# Patient Record
Sex: Female | Born: 1970 | Race: Black or African American | Hispanic: No | Marital: Married | Smoking: Never smoker
Health system: Southern US, Community
[De-identification: ages and names within clinical notes are randomized; demographics above are authoritative.]

## PROBLEM LIST (undated history)

## (undated) DIAGNOSIS — R3915 Urgency of urination: Secondary | ICD-10-CM

## (undated) DIAGNOSIS — K219 Gastro-esophageal reflux disease without esophagitis: Secondary | ICD-10-CM

## (undated) DIAGNOSIS — I10 Essential (primary) hypertension: Secondary | ICD-10-CM

## (undated) DIAGNOSIS — D219 Benign neoplasm of connective and other soft tissue, unspecified: Secondary | ICD-10-CM

## (undated) HISTORY — DX: Urgency of urination: R39.15

## (undated) HISTORY — DX: Gastro-esophageal reflux disease without esophagitis: K21.9

## (undated) HISTORY — PX: TUBAL LIGATION: SHX77

## (undated) HISTORY — DX: Essential (primary) hypertension: I10

## (undated) HISTORY — PX: ENDOMETRIAL ABLATION: SHX621

## (undated) HISTORY — DX: Benign neoplasm of connective and other soft tissue, unspecified: D21.9

---

## 1999-01-24 ENCOUNTER — Other Ambulatory Visit: Admission: RE | Admit: 1999-01-24 | Discharge: 1999-01-24 | Payer: Self-pay | Admitting: Internal Medicine

## 2003-05-23 ENCOUNTER — Other Ambulatory Visit: Admission: RE | Admit: 2003-05-23 | Discharge: 2003-05-23 | Payer: Self-pay | Admitting: Obstetrics and Gynecology

## 2004-05-26 ENCOUNTER — Other Ambulatory Visit: Admission: RE | Admit: 2004-05-26 | Discharge: 2004-05-26 | Payer: Self-pay | Admitting: Obstetrics and Gynecology

## 2004-11-25 ENCOUNTER — Emergency Department (HOSPITAL_COMMUNITY): Admission: EM | Admit: 2004-11-25 | Discharge: 2004-11-25 | Payer: Self-pay | Admitting: Emergency Medicine

## 2005-06-02 ENCOUNTER — Other Ambulatory Visit: Admission: RE | Admit: 2005-06-02 | Discharge: 2005-06-02 | Payer: Self-pay | Admitting: Obstetrics and Gynecology

## 2006-06-11 ENCOUNTER — Other Ambulatory Visit: Admission: RE | Admit: 2006-06-11 | Discharge: 2006-06-11 | Payer: Self-pay | Admitting: Obstetrics and Gynecology

## 2006-09-18 ENCOUNTER — Emergency Department (HOSPITAL_COMMUNITY): Admission: EM | Admit: 2006-09-18 | Discharge: 2006-09-18 | Payer: Self-pay | Admitting: Emergency Medicine

## 2006-11-19 ENCOUNTER — Ambulatory Visit: Admission: RE | Admit: 2006-11-19 | Discharge: 2006-11-19 | Payer: Self-pay | Admitting: Obstetrics and Gynecology

## 2006-11-19 ENCOUNTER — Encounter: Payer: Self-pay | Admitting: Vascular Surgery

## 2007-07-06 ENCOUNTER — Other Ambulatory Visit: Admission: RE | Admit: 2007-07-06 | Discharge: 2007-07-06 | Payer: Self-pay | Admitting: Obstetrics and Gynecology

## 2007-11-10 HISTORY — PX: VAGINAL HYSTERECTOMY: SUR661

## 2007-11-23 ENCOUNTER — Ambulatory Visit (HOSPITAL_BASED_OUTPATIENT_CLINIC_OR_DEPARTMENT_OTHER): Admission: RE | Admit: 2007-11-23 | Discharge: 2007-11-23 | Payer: Self-pay | Admitting: Surgery

## 2007-11-23 ENCOUNTER — Ambulatory Visit (HOSPITAL_COMMUNITY): Admission: RE | Admit: 2007-11-23 | Discharge: 2007-11-23 | Payer: Self-pay | Admitting: Surgery

## 2007-11-24 ENCOUNTER — Encounter: Admission: RE | Admit: 2007-11-24 | Discharge: 2008-02-22 | Payer: Self-pay | Admitting: Surgery

## 2007-11-28 ENCOUNTER — Ambulatory Visit (HOSPITAL_COMMUNITY): Admission: RE | Admit: 2007-11-28 | Discharge: 2007-11-28 | Payer: Self-pay | Admitting: Surgery

## 2007-12-02 ENCOUNTER — Ambulatory Visit: Payer: Self-pay | Admitting: Internal Medicine

## 2008-01-23 ENCOUNTER — Ambulatory Visit (HOSPITAL_COMMUNITY): Admission: RE | Admit: 2008-01-23 | Discharge: 2008-01-23 | Payer: Self-pay | Admitting: Surgery

## 2008-05-31 ENCOUNTER — Encounter: Payer: Self-pay | Admitting: Obstetrics and Gynecology

## 2008-05-31 ENCOUNTER — Ambulatory Visit (HOSPITAL_BASED_OUTPATIENT_CLINIC_OR_DEPARTMENT_OTHER): Admission: RE | Admit: 2008-05-31 | Discharge: 2008-06-01 | Payer: Self-pay | Admitting: Obstetrics and Gynecology

## 2008-07-20 ENCOUNTER — Ambulatory Visit: Payer: Self-pay | Admitting: Obstetrics and Gynecology

## 2008-09-17 ENCOUNTER — Encounter: Payer: Self-pay | Admitting: Obstetrics and Gynecology

## 2008-09-17 ENCOUNTER — Ambulatory Visit: Payer: Self-pay | Admitting: Obstetrics and Gynecology

## 2008-09-17 ENCOUNTER — Other Ambulatory Visit: Admission: RE | Admit: 2008-09-17 | Discharge: 2008-09-17 | Payer: Self-pay | Admitting: Obstetrics and Gynecology

## 2009-03-08 ENCOUNTER — Ambulatory Visit: Payer: Self-pay | Admitting: Obstetrics and Gynecology

## 2009-09-26 ENCOUNTER — Other Ambulatory Visit: Admission: RE | Admit: 2009-09-26 | Discharge: 2009-09-26 | Payer: Self-pay | Admitting: Obstetrics and Gynecology

## 2009-09-26 ENCOUNTER — Ambulatory Visit: Payer: Self-pay | Admitting: Obstetrics and Gynecology

## 2010-07-16 ENCOUNTER — Encounter: Admission: RE | Admit: 2010-07-16 | Discharge: 2010-08-07 | Payer: Self-pay | Admitting: *Deleted

## 2010-09-30 ENCOUNTER — Other Ambulatory Visit
Admission: RE | Admit: 2010-09-30 | Discharge: 2010-09-30 | Payer: Self-pay | Source: Home / Self Care | Admitting: Obstetrics and Gynecology

## 2010-09-30 ENCOUNTER — Ambulatory Visit: Payer: Self-pay | Admitting: Obstetrics and Gynecology

## 2011-03-24 NOTE — Op Note (Signed)
Ann Graves, COURSER NO.:  [id number]   MEDICAL RECORD NO.:  [id number]           PATIENT TYPE:   LOCATION:                                 FACILITY:   PHYSICIAN:  Daniel L. Gottsegen, M.D.DATE OF BIRTH:  1970/12/28   DATE OF PROCEDURE:  05/31/2008  DATE OF DISCHARGE:                               OPERATIVE REPORT   PREOPERATIVE DIAGNOSIS:  Menometrorrhagia with multiple fibroids  including one submucous myoma.   POSTOPERATIVE DIAGNOSIS:  Menometrorrhagia with multiple fibroids  including one submucous myoma.   OPERATION:  Vaginal hysterectomy.   SURGEON:  Daniel L. Eda Paschal, M.D.   FIRST ASSISTANT:  Juan H. Lily Peer, M.D.   ANESTHESIA:  General endotracheal.   INDICATIONS:  The patient is a 40 year old gravida 3, para 2, AB 1 who  had presented to the office with menometrorrhagia associated with  anemia.  The patient has had a previous tubal ligation.  Because of the  menometrorrhagia she underwent endometrial biopsies.  She underwent  saline infusion histogram.  At saline infusion histogram she had  multiple myomas.  She had approximately 6 of them, one of them was  submucous but it was mostly outside the uterine cavity in the  myometrium.  As a result of this persistent abnormal bleeding associated  with anemia she now enters the hospital for a vaginal hysterectomy.  Because of her age we will preserve her ovaries.   FINDINGS:  The patient's uterus was enlarged by multiple myomas to 252  grams.  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  Pelvic peritoneum was free of all  disease.   PROCEDURE IN DETAIL:  After adequate general endotracheal anesthesia the  patient was placed in the dorsal lithotomy position, prepped and draped  in the usual sterile manner.  A 1:200,000 solution of epinephrine and 1%  Xylocaine was injected around the cervix.  A 360 degree incision was  made around the  cervix.  The bladder was mobilized superiorly as was the  posterior peritoneum.  Posterior peritoneum and vesicouterine fold of  peritoneum were entered with sharp dissection.  The uterosacral  ligaments were clamped.  In clamping them they were shortened.  They  were sutured to the vault laterally for good vault support.  Cardinal  ligaments, uterine arteries and more of the broad ligament was clamped,  cut and suture ligated.  The uterus was then partially delivered.  Multiple myomectomies were done to compress the uterus so that it could  be completely delivered.  It was then completely delivered.  The  remainder of the broad ligament, utero-ovarian ligament, round ligament  and fallopian tubes were clamped, cut and suture ligated as well.  All  major vascular bundles were doubly ligated.  Suture material was zero  chromic for all the above.  The uterus was delivered in pieces and sent  to pathology for tissue diagnosis.  It was weighed at 252 grams.  The  hydatid cyst on her left fallopian tube was excised.  It was on a long  pedicle which was  clamped and cut and tied with zero Vicryl.  The  vaginal cuff was stitched to the posterior peritoneum with a running  locking zero Vicryl.  Copious irrigation was done with sterile saline.  Two sponge, needle and instrument counts were correct.  The cuff and the  peritoneum were closed with figure-of-eights of zero chromic.  In doing  that the cul-  de-sac was eliminated using a Halban's technique and entire cuff was  brought back together.  She was copiously irrigated again.  Blood loss  the entire procedure was 100 mL.  The patient tolerated the procedure  well and left the operating room in satisfactory condition draining  clear urine from her Foley catheter.      Daniel L. Eda Paschal, M.D.  Electronically Signed     DLG/MEDQ  D:  05/31/2008  T:  05/31/2008  Job:  [id number]

## 2011-03-24 NOTE — Procedures (Signed)
NAME:  Ann Graves, Ann Graves NO.:  [id number]   MEDICAL RECORD NO.:  [id number]          PATIENT TYPE:  OUT   LOCATION:  SLEEP CENTER                 FACILITY:  [facility]   PHYSICIAN:  Clinton D. Maple Hudson, MD, FCCP, FACPDATE OF BIRTH:  09/06/1970   DATE OF STUDY:  11/23/2007                            NOCTURNAL POLYSOMNOGRAM   REFERRING PHYSICIAN:  Thornton Park. Daphine Deutscher, MD   INDICATION FOR STUDY:  Hyposomnia with sleep apnea.   EPWORTH SLEEPINESS SCORE:  2/24, BMI 40.2, weight 220 pounds, height 62  inches, neck 14.5 inches.   MEDICATIONS:  Not reported.   Note that the patient states that she wakes up with heartburn and  choking.   SLEEP ARCHITECTURE:  Total sleep time 425 minutes with sleep deficiency  94.2%.  Stage 1 8%, stage 2 67.4%, stage 3 0.1%, REM 24.5% of total  sleep time.  Sleep latency 8.5 minutes, REM latency 86.5 minutes,  arousal index 4.2, awake after sleep onset 17 minutes.  No bedtime  medication taken.   RESPIRATORY DATA:  Apnea hypopnea index (AHI) 3.5 events per hour which  is within normal limits.  There were a total of 25 respiratory events  scored of which 4 were obstructed apneas and 21 hypopneas.  Events were  more common while supine.   OXYGEN DATA:  Very loud snoring with oxygen desaturation to a nadir of  89%.  Mean oxygen saturation through the study was 97.2% on room air.   CARDIAC DATA:  Normal sinus rhythm.   MOVEMENT-PARASOMNIA:  No significant movement disturbances, no bathroom  trips.   IMPRESSIONS-RECOMMENDATIONS:  1. Unremarkable sleep architecture.  Occasional respiratory events,      within normal limits, AHI 3.5 per hour (normal range 0.25 per      hour).  Events were more common while supine as expected.  Very      loud snoring with      oxygen desaturation to a nadir of 89%.  2. Note the patient comments that she occasionally awakens with      choking and heartburn.      Clinton D. Maple Hudson, MD, [facility], FACP  Diplomate,  Biomedical engineer of Sleep Medicine  Electronically Signed     CDY/MEDQ  D:  12/03/2007 09:36:37  T:  12/03/2007 20:44:55  Job:  [id number]

## 2011-06-18 ENCOUNTER — Encounter: Payer: Self-pay | Admitting: Obstetrics and Gynecology

## 2011-06-18 ENCOUNTER — Ambulatory Visit (INDEPENDENT_AMBULATORY_CARE_PROVIDER_SITE_OTHER): Payer: BC Managed Care – PPO | Admitting: Obstetrics and Gynecology

## 2011-06-18 ENCOUNTER — Encounter: Payer: Self-pay | Admitting: Gynecology

## 2011-06-18 DIAGNOSIS — B373 Candidiasis of vulva and vagina: Secondary | ICD-10-CM

## 2011-06-18 DIAGNOSIS — N760 Acute vaginitis: Secondary | ICD-10-CM

## 2011-06-18 DIAGNOSIS — B9689 Other specified bacterial agents as the cause of diseases classified elsewhere: Secondary | ICD-10-CM

## 2011-06-18 DIAGNOSIS — N949 Unspecified condition associated with female genital organs and menstrual cycle: Secondary | ICD-10-CM

## 2011-06-18 DIAGNOSIS — N898 Other specified noninflammatory disorders of vagina: Secondary | ICD-10-CM

## 2011-06-18 DIAGNOSIS — A499 Bacterial infection, unspecified: Secondary | ICD-10-CM

## 2011-06-18 NOTE — Progress Notes (Signed)
The patient came to see me today with a week history of a vaginal discharge with a disagreable odor. She has not tried any medication herself. She's had no vaginal bleeding with this.  External within normal limits. BUS within normal limits. Vaginal examination reveals a frothy discharge. Wet prep is positive for yeast,amine, and clue cells.  Assessment: 1. Bacterial vaginosis 2. Yeast vaginitis.  Plan: 1. Metrogel  vaginal cream in vagina for 5 days.2. Diflucan 200 mg daily for 4 days.

## 2011-08-17 ENCOUNTER — Other Ambulatory Visit: Payer: Self-pay | Admitting: Obstetrics and Gynecology

## 2011-08-17 DIAGNOSIS — Z1231 Encounter for screening mammogram for malignant neoplasm of breast: Secondary | ICD-10-CM

## 2011-09-01 ENCOUNTER — Ambulatory Visit (HOSPITAL_COMMUNITY)
Admission: RE | Admit: 2011-09-01 | Discharge: 2011-09-01 | Disposition: A | Discharge status: Home / Self Care | Payer: BC Managed Care – PPO | Source: Ambulatory Visit | Attending: Obstetrics and Gynecology | Admitting: Obstetrics and Gynecology

## 2011-09-01 DIAGNOSIS — Z1231 Encounter for screening mammogram for malignant neoplasm of breast: Secondary | ICD-10-CM | POA: Insufficient documentation

## 2011-09-08 ENCOUNTER — Other Ambulatory Visit: Payer: Self-pay | Admitting: *Deleted

## 2011-09-08 DIAGNOSIS — N63 Unspecified lump in unspecified breast: Secondary | ICD-10-CM

## 2011-09-18 ENCOUNTER — Ambulatory Visit
Admission: RE | Admit: 2011-09-18 | Discharge: 2011-09-18 | Disposition: A | Discharge status: Home / Self Care | Payer: BC Managed Care – PPO | Source: Ambulatory Visit | Attending: Obstetrics and Gynecology | Admitting: Obstetrics and Gynecology

## 2011-09-18 DIAGNOSIS — N63 Unspecified lump in unspecified breast: Secondary | ICD-10-CM

## 2012-01-13 ENCOUNTER — Encounter (HOSPITAL_COMMUNITY): Payer: Self-pay | Admitting: Emergency Medicine

## 2012-01-13 ENCOUNTER — Emergency Department (HOSPITAL_COMMUNITY)
Admission: EM | Admit: 2012-01-13 | Discharge: 2012-01-14 | Disposition: A | Discharge status: Home / Self Care | Payer: BC Managed Care – PPO | Attending: Emergency Medicine | Admitting: Emergency Medicine

## 2012-01-13 DIAGNOSIS — K219 Gastro-esophageal reflux disease without esophagitis: Secondary | ICD-10-CM | POA: Insufficient documentation

## 2012-01-13 DIAGNOSIS — A059 Bacterial foodborne intoxication, unspecified: Secondary | ICD-10-CM | POA: Insufficient documentation

## 2012-01-13 DIAGNOSIS — R197 Diarrhea, unspecified: Secondary | ICD-10-CM | POA: Insufficient documentation

## 2012-01-13 DIAGNOSIS — E119 Type 2 diabetes mellitus without complications: Secondary | ICD-10-CM | POA: Insufficient documentation

## 2012-01-13 DIAGNOSIS — E669 Obesity, unspecified: Secondary | ICD-10-CM | POA: Insufficient documentation

## 2012-01-13 DIAGNOSIS — R112 Nausea with vomiting, unspecified: Secondary | ICD-10-CM | POA: Insufficient documentation

## 2012-01-13 DIAGNOSIS — I10 Essential (primary) hypertension: Secondary | ICD-10-CM | POA: Insufficient documentation

## 2012-01-13 DIAGNOSIS — Z79899 Other long term (current) drug therapy: Secondary | ICD-10-CM | POA: Insufficient documentation

## 2012-01-13 DIAGNOSIS — R109 Unspecified abdominal pain: Secondary | ICD-10-CM | POA: Insufficient documentation

## 2012-01-13 LAB — CBC
HCT: 41.5 % (ref 36.0–46.0)
Hemoglobin: 13.6 g/dL (ref 12.0–15.0)
MCH: 27.5 pg (ref 26.0–34.0)
MCHC: 32.8 g/dL (ref 30.0–36.0)
RDW: 13.2 % (ref 11.5–15.5)

## 2012-01-13 LAB — DIFFERENTIAL
Basophils Absolute: 0 10*3/uL (ref 0.0–0.1)
Basophils Relative: 0 % (ref 0–1)
Eosinophils Absolute: 0.1 10*3/uL (ref 0.0–0.7)
Eosinophils Relative: 1 % (ref 0–5)
Monocytes Absolute: 0.4 10*3/uL (ref 0.1–1.0)
Monocytes Relative: 5 % (ref 3–12)

## 2012-01-13 NOTE — ED Notes (Signed)
Patient complaining of abdominal pain, nausea, vomiting, and diarrhea since this afternoon.  Patient reports she had chinese for lunch and that it "did not sit right of her stomach".

## 2012-01-14 LAB — URINALYSIS, ROUTINE W REFLEX MICROSCOPIC
Bilirubin Urine: NEGATIVE
Glucose, UA: NEGATIVE mg/dL
Ketones, ur: NEGATIVE mg/dL
Protein, ur: NEGATIVE mg/dL
pH: 7.5 (ref 5.0–8.0)

## 2012-01-14 LAB — BASIC METABOLIC PANEL
BUN: 15 mg/dL (ref 6–23)
Calcium: 9.4 mg/dL (ref 8.4–10.5)
Chloride: 102 mEq/L (ref 96–112)
Creatinine, Ser: 0.56 mg/dL (ref 0.50–1.10)
GFR calc Af Amer: >90 mL/min (ref >90)
GFR calc non Af Amer: >90 mL/min (ref >90)

## 2012-01-14 MED ORDER — HYOSCYAMINE SULFATE 0.125 MG PO TABS
0.2500 mg | ORAL_TABLET | Freq: Once | ORAL | Status: AC
  Administered 2012-01-14: 0.25 mg via ORAL
  Filled 2012-01-14: qty 2

## 2012-01-14 MED ORDER — SODIUM CHLORIDE 0.9 % IV BOLUS (SEPSIS)
1000.0000 mL | Freq: Once | INTRAVENOUS | Status: AC
  Administered 2012-01-14: 1000 mL via INTRAVENOUS

## 2012-01-14 MED ORDER — METOCLOPRAMIDE HCL 10 MG PO TABS: 10.0000 mg | ORAL_TABLET | Freq: Four times a day (QID) | ORAL | Status: AC

## 2012-01-14 MED ORDER — DIPHENOXYLATE-ATROPINE 2.5-0.025 MG PO TABS: 1.0000 | ORAL_TABLET | Freq: Four times a day (QID) | ORAL | Status: AC | PRN

## 2012-01-14 MED ORDER — ONDANSETRON HCL 4 MG/2ML IJ SOLN
4.0000 mg | Freq: Once | INTRAMUSCULAR | Status: AC
  Administered 2012-01-14: 4 mg via INTRAVENOUS
  Filled 2012-01-14: qty 2

## 2012-01-14 MED ORDER — DIPHENOXYLATE-ATROPINE 2.5-0.025 MG PO TABS
1.0000 | ORAL_TABLET | Freq: Once | ORAL | Status: AC
  Administered 2012-01-14: 1 via ORAL
  Filled 2012-01-14: qty 1

## 2012-01-14 NOTE — ED Notes (Signed)
Patient stated she is feeling a lot better

## 2012-01-14 NOTE — ED Provider Notes (Signed)
History     CSN: [id number]  Arrival date & time 01/13/12  2228   First MD Initiated Contact with Patient 01/14/12 0132      Chief Complaint  Patient presents with  . Abdominal Pain  . Diarrhea    (Consider location/radiation/quality/duration/timing/severity/associated sxs/prior treatment) Patient is a 41 y.o. female presenting with abdominal pain and diarrhea. The history is provided by the patient.  Abdominal Pain The primary symptoms of the illness include abdominal pain, nausea, vomiting and diarrhea. The primary symptoms of the illness do not include fever.  Symptoms associated with the illness do not include chills.  Diarrhea The primary symptoms include abdominal pain, nausea, vomiting and diarrhea. Primary symptoms do not include fever or rash.  The illness does not include chills.   the patient is a 41 year old, female, with non-insulin-dependent diabetes, who presents to emergency department complaining of nausea, vomiting, abdominal cramps and diarrhea since she had [place] food yesterday.  She denies fevers, chills, or rash.  She has not been on antibiotics recently.  She has not been exposed to anybody with similar symptoms.  Past Medical History  Diagnosis Date  . GERD (gastroesophageal reflux disease)   . Fibroid     Adenomyoma  . Diabetes mellitus   . Hypertension     Past Surgical History  Procedure Date  . Tubal ligation   . Vaginal hysterectomy 2009    Left paratubal cyst    Family History  Problem Relation Age of Onset  . Diabetes Mother   . Hypertension Mother   . Heart disease Mother   . Diabetes Father   . Hypertension Father   . Heart disease Father   . Hypertension Maternal Grandmother   . Heart disease Maternal Grandmother   . Hypertension Maternal Grandfather     History  Substance Use Topics  . Smoking status: Never Smoker   . Smokeless tobacco: Never Used  . Alcohol Use: No    OB History    Grav Para Term Preterm Abortions TAB  SAB Ect Mult Living   3 2 2  1     2       Review of Systems  Constitutional: Negative for fever and chills.  Gastrointestinal: Positive for nausea, vomiting, abdominal pain and diarrhea. Negative for blood in stool.  Skin: Negative for rash.  Neurological: Negative for headaches.  All other systems reviewed and are negative.    Allergies  Review of patient's allergies indicates no known allergies.  Home Medications   Current Outpatient Rx  Name Route Sig Dispense Refill  . GLYBURIDE 5 MG PO TABS Oral Take 5 mg by mouth daily with breakfast.    . LISINOPRIL-HYDROCHLOROTHIAZIDE 20-25 MG PO TABS Oral Take 1 tablet by mouth daily.      BP 114/76  Pulse 79  Temp(Src) 97.7 F (36.5 C) (Oral)  Resp 20  SpO2 100%  Physical Exam  Vitals reviewed. Constitutional: She is oriented to person, place, and time. No distress.       Obese listless  HENT:  Head: Normocephalic and atraumatic.  Eyes: Conjunctivae are normal. Pupils are equal, round, and reactive to light.  Neck: Normal range of motion. Neck supple.  Cardiovascular: Normal rate.   No murmur heard. Pulmonary/Chest: Effort normal. No respiratory distress.  Abdominal: Soft. She exhibits no distension. There is no tenderness. There is no rebound and no guarding.  Musculoskeletal: Normal range of motion.  Neurological: She is alert and oriented to person, place, and time.  Skin: Skin  is warm and dry.  Psychiatric: She has a normal mood and affect. Thought content normal.    ED Course  Procedures (including critical care time) 41 year old, female, with non-insulin-dependent diabetes.  Presents emergency department with symptoms consistent with food poisoning.  She got, vomiting, diarrhea, and abdominal cramps.  Following eating [place] food yesterday.  She is not toxic and does not have an acute abdomen.  We will establish an IV give her fluids and treat her symptoms  Labs Reviewed  DIFFERENTIAL - Abnormal; Notable for the  following:    Neutrophils Relative 82 (*)    All other components within normal limits  BASIC METABOLIC PANEL - Abnormal; Notable for the following:    Glucose, Bld 138 (*)    All other components within normal limits  URINALYSIS, ROUTINE W REFLEX MICROSCOPIC  CBC  POCT PREGNANCY, URINE   No results found.   No diagnosis found.  3:23 AM Still has pain. Will give lomotil  4:08 AM sxs resolved  MDM  Food poisoning sxs resolved in ed. Not toxic. No distress.        Nicholes Stairs, MD 01/14/12 [phone]

## 2012-02-16 ENCOUNTER — Other Ambulatory Visit: Payer: Self-pay | Admitting: Obstetrics and Gynecology

## 2012-02-16 ENCOUNTER — Encounter: Payer: Self-pay | Admitting: Obstetrics and Gynecology

## 2012-02-16 ENCOUNTER — Ambulatory Visit (INDEPENDENT_AMBULATORY_CARE_PROVIDER_SITE_OTHER): Payer: BC Managed Care – PPO | Admitting: Obstetrics and Gynecology

## 2012-02-16 ENCOUNTER — Other Ambulatory Visit (HOSPITAL_COMMUNITY)
Admission: RE | Admit: 2012-02-16 | Discharge: 2012-02-16 | Disposition: A | Discharge status: Home / Self Care | Payer: BC Managed Care – PPO | Source: Ambulatory Visit | Attending: Obstetrics and Gynecology | Admitting: Obstetrics and Gynecology

## 2012-02-16 VITALS — BP 116/74 | Ht 63.0 in | Wt 224.0 lb

## 2012-02-16 DIAGNOSIS — Z01419 Encounter for gynecological examination (general) (routine) without abnormal findings: Secondary | ICD-10-CM

## 2012-02-16 DIAGNOSIS — R3915 Urgency of urination: Secondary | ICD-10-CM | POA: Insufficient documentation

## 2012-02-16 LAB — URINALYSIS W MICROSCOPIC + REFLEX CULTURE
Bilirubin Urine: NEGATIVE
Crystals: NONE SEEN
Hgb urine dipstick: NEGATIVE
Ketones, ur: NEGATIVE mg/dL
Protein, ur: NEGATIVE mg/dL
RBC / HPF: NONE SEEN RBC/hpf (ref <3)
Specific Gravity, Urine: 1.025 (ref 1.005–1.030)
Urobilinogen, UA: 0.2 mg/dL (ref 0.0–1.0)

## 2012-02-16 NOTE — Progress Notes (Signed)
The patient came back to see me today for her annual GYN exam. The past week she's had urinary frequency. She is not having dysuria, urgency, hematuria or incontinence. She is having no menopausal symptoms. Her urinalysis was just slightly abnormal. She is due for followup mammogram for abnormality found in October which was nonsuspicious. She will make an appointment. She is having no vaginal bleeding. She is having no pelvic pain.  HEENT: Within normal limits. Kennon Portela present Neck: No masses. Supraclavicular lymph nodes: Not enlarged. Breasts: Examined in both sitting and lying position. Symmetrical without skin changes or masses. Abdomen: Soft no masses guarding or rebound. No hernias. Pelvic: External within normal limits. BUS within normal limits. Vaginal examination shows good estrogen effect, no cystocele enterocele or rectocele. Cervix and uterus absent. Adnexa within normal limits. Rectovaginal confirmatory. Extremities within normal limits.  Assessment: Urinary frequency. Abnormal mammogram nonsuspicious.(probable benign lymph node)  Plan: Urine culture done. Discussed medication for DI if culture negative. Doubt patient will do since her symptoms are only one week. Followup mammogram of right breast. Patient will schedule.

## 2012-02-18 LAB — URINE CULTURE: Colony Count: 3000

## 2012-04-01 ENCOUNTER — Other Ambulatory Visit: Payer: Self-pay | Admitting: *Deleted

## 2012-04-01 DIAGNOSIS — N63 Unspecified lump in unspecified breast: Secondary | ICD-10-CM

## 2012-04-20 ENCOUNTER — Ambulatory Visit
Admission: RE | Admit: 2012-04-20 | Discharge: 2012-04-20 | Disposition: A | Discharge status: Home / Self Care | Payer: BC Managed Care – PPO | Source: Ambulatory Visit | Attending: Obstetrics and Gynecology | Admitting: Obstetrics and Gynecology

## 2012-04-20 DIAGNOSIS — N63 Unspecified lump in unspecified breast: Secondary | ICD-10-CM

## 2012-06-09 HISTORY — PX: THYROIDECTOMY: SHX17

## 2012-09-19 ENCOUNTER — Ambulatory Visit: Payer: BC Managed Care – PPO | Admitting: Women's Health

## 2012-09-26 ENCOUNTER — Ambulatory Visit: Payer: BC Managed Care – PPO | Admitting: Women's Health

## 2012-10-17 ENCOUNTER — Encounter: Payer: Self-pay | Admitting: Gynecology

## 2012-10-17 ENCOUNTER — Ambulatory Visit: Payer: BC Managed Care – PPO | Admitting: Obstetrics and Gynecology

## 2012-10-17 ENCOUNTER — Ambulatory Visit (INDEPENDENT_AMBULATORY_CARE_PROVIDER_SITE_OTHER): Payer: Managed Care, Other (non HMO) | Admitting: Gynecology

## 2012-10-17 VITALS — BP 128/88

## 2012-10-17 DIAGNOSIS — I10 Essential (primary) hypertension: Secondary | ICD-10-CM | POA: Insufficient documentation

## 2012-10-17 DIAGNOSIS — N898 Other specified noninflammatory disorders of vagina: Secondary | ICD-10-CM

## 2012-10-17 DIAGNOSIS — B373 Candidiasis of vulva and vagina: Secondary | ICD-10-CM

## 2012-10-17 DIAGNOSIS — E119 Type 2 diabetes mellitus without complications: Secondary | ICD-10-CM | POA: Insufficient documentation

## 2012-10-17 DIAGNOSIS — Z113 Encounter for screening for infections with a predominantly sexual mode of transmission: Secondary | ICD-10-CM

## 2012-10-17 LAB — WET PREP FOR TRICH, YEAST, CLUE

## 2012-10-17 MED ORDER — FLUCONAZOLE 100 MG PO TABS: ORAL_TABLET | ORAL | Status: DC

## 2012-10-17 NOTE — Progress Notes (Signed)
Patient is a type II diabetic the presented to the office today complaining over the past 3-4 weeks the vulvovaginal irritation pruritus. She brought Monistat over-the-counter yesterday and today was a secondary she applied the cream. She's not been sexually active over the past month. She has a history of a transvaginal hysterectomy in the past. She stated the recently she also had total thyroidectomy for 2 benign thyroid nodules in [facility] and is being followed by Dr. Boris Sharper endocrinologist.  Exam: Bartholin urethra Skene glands within normal limits External genitalia slightly raw and irritated from pruritus Vagina: White creamy material in the vagina probably attributed to the Monistat as she recently applied. Vaginal cuff: Intact Bimanual exam not done Rectal exam: Not done  Wet prep few yeast noted few white blood cells moderate bacteria.  Assessment/plan: Monilial vulvovaginitis. Patient to continue with her Monistat to complete the weeks course. I've given her prescription Diflucan 100 mg which she can take 1 tablet today to accelerate and improve with her symptoms. I have  given her 2 refills. I've explained to her that if her sugars at times may be out of control  That this may predispose her to yeast infections. Also since she recently had surgery and probably received antibiotics may have been a contributing factor as well. GC and Chlamydia culture pending at time of this dictation.

## 2012-10-17 NOTE — Patient Instructions (Addendum)
Candidal Vulvovaginitis Candidal vulvovaginitis is an infection of the vagina and vulva. The vulva is the skin around the opening of the vagina. This may cause itching and discomfort in and around the vagina.  HOME CARE  Only take medicine as told by your doctor.  Do not have sex (intercourse) until the infection is healed or as told by your doctor.  Practice safe sex.  Tell your sex partner about your infection.  Do not douche or use tampons.  Wear cotton underwear. Do not wear tight pants or panty hose.  Eat yogurt. This may help treat and prevent yeast infections. GET HELP RIGHT AWAY IF:   You have a fever.  Your problems get worse during treatment or do not get better in 3 days.  You have discomfort, irritation, or itching in your vagina or vulva area.  You have pain after sex.  You start to get belly (abdominal) pain. MAKE SURE YOU:  Understand these instructions.  Will watch your condition.  Will get help right away if you are not doing well or get worse. Document Released: 01/22/2009 Document Revised: 01/18/2012 Document Reviewed: 01/22/2009 [employer] Patient Information 2013 [employer].  

## 2014-09-10 ENCOUNTER — Encounter: Payer: Self-pay | Admitting: Gynecology

## 2016-02-25 ENCOUNTER — Other Ambulatory Visit: Payer: Self-pay

## 2016-02-25 DIAGNOSIS — Z1231 Encounter for screening mammogram for malignant neoplasm of breast: Secondary | ICD-10-CM

## 2016-03-16 ENCOUNTER — Ambulatory Visit: Payer: Managed Care, Other (non HMO)

## 2016-04-29 ENCOUNTER — Ambulatory Visit
Admission: RE | Admit: 2016-04-29 | Discharge: 2016-04-29 | Disposition: A | Discharge status: Home / Self Care | Payer: Managed Care, Other (non HMO) | Source: Ambulatory Visit

## 2016-04-29 ENCOUNTER — Ambulatory Visit: Payer: Managed Care, Other (non HMO)

## 2016-04-29 DIAGNOSIS — Z1231 Encounter for screening mammogram for malignant neoplasm of breast: Secondary | ICD-10-CM

## 2016-06-25 ENCOUNTER — Encounter: Payer: Self-pay | Admitting: Podiatry

## 2016-06-25 ENCOUNTER — Ambulatory Visit (INDEPENDENT_AMBULATORY_CARE_PROVIDER_SITE_OTHER): Payer: Managed Care, Other (non HMO) | Admitting: Podiatry

## 2016-06-25 VITALS — BP 115/66 | HR 80 | Resp 16 | Ht 65.0 in | Wt 238.0 lb

## 2016-06-25 DIAGNOSIS — E1149 Type 2 diabetes mellitus with other diabetic neurological complication: Secondary | ICD-10-CM | POA: Diagnosis not present

## 2016-06-25 DIAGNOSIS — E1140 Type 2 diabetes mellitus with diabetic neuropathy, unspecified: Secondary | ICD-10-CM

## 2016-06-25 DIAGNOSIS — M216X9 Other acquired deformities of unspecified foot: Secondary | ICD-10-CM

## 2016-06-25 DIAGNOSIS — Q828 Other specified congenital malformations of skin: Secondary | ICD-10-CM

## 2016-06-25 NOTE — Progress Notes (Signed)
   Subjective:    Patient ID: Ann Graves, female    DOB: [birth year], 45 y.o.   MRN: [id number]  HPI Chief Complaint  Patient presents with  . Callouses    Right foot; plantar forefoot; pt stated, "Hurts to walk on foot; tender to the touch on the outside of the callous"; pt Diabetic Type 2; Sugar=129 this am; A1C=7.0      Review of Systems  All other systems reviewed and are negative.      Objective:   Physical Exam        Assessment & Plan:

## 2016-08-01 IMAGING — MG DIGITAL SCREENING BILATERAL MAMMOGRAM WITH CAD
4 series · 4 of 4 positions shown · non-contrast
Comparison: Previous exam(s).

CLINICAL DATA: Screening.

EXAM:
DIGITAL SCREENING BILATERAL MAMMOGRAM WITH CAD

[L CC]
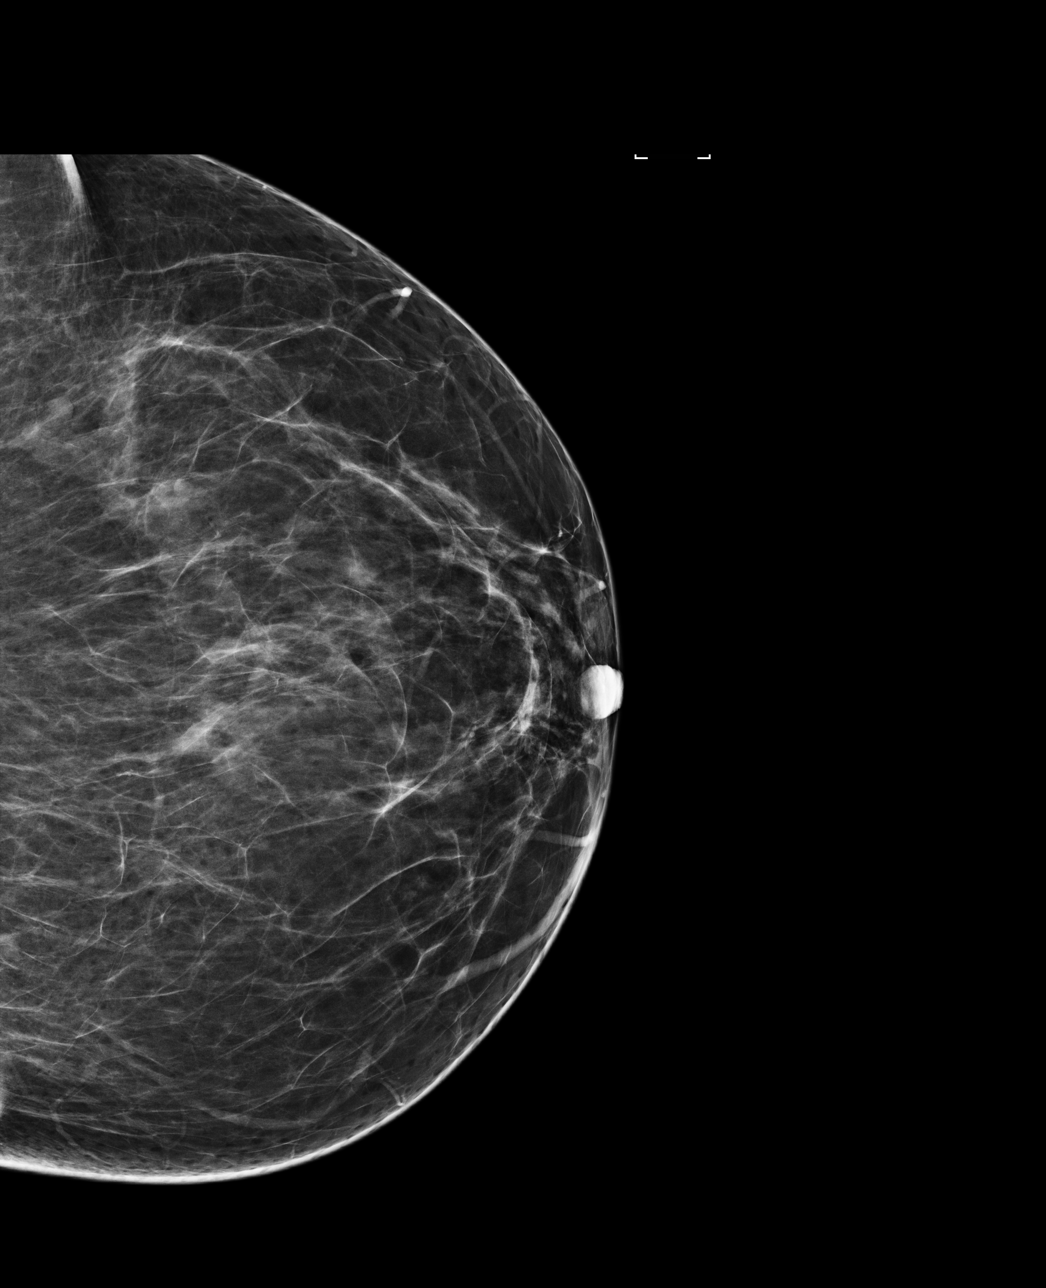

[L MLO]
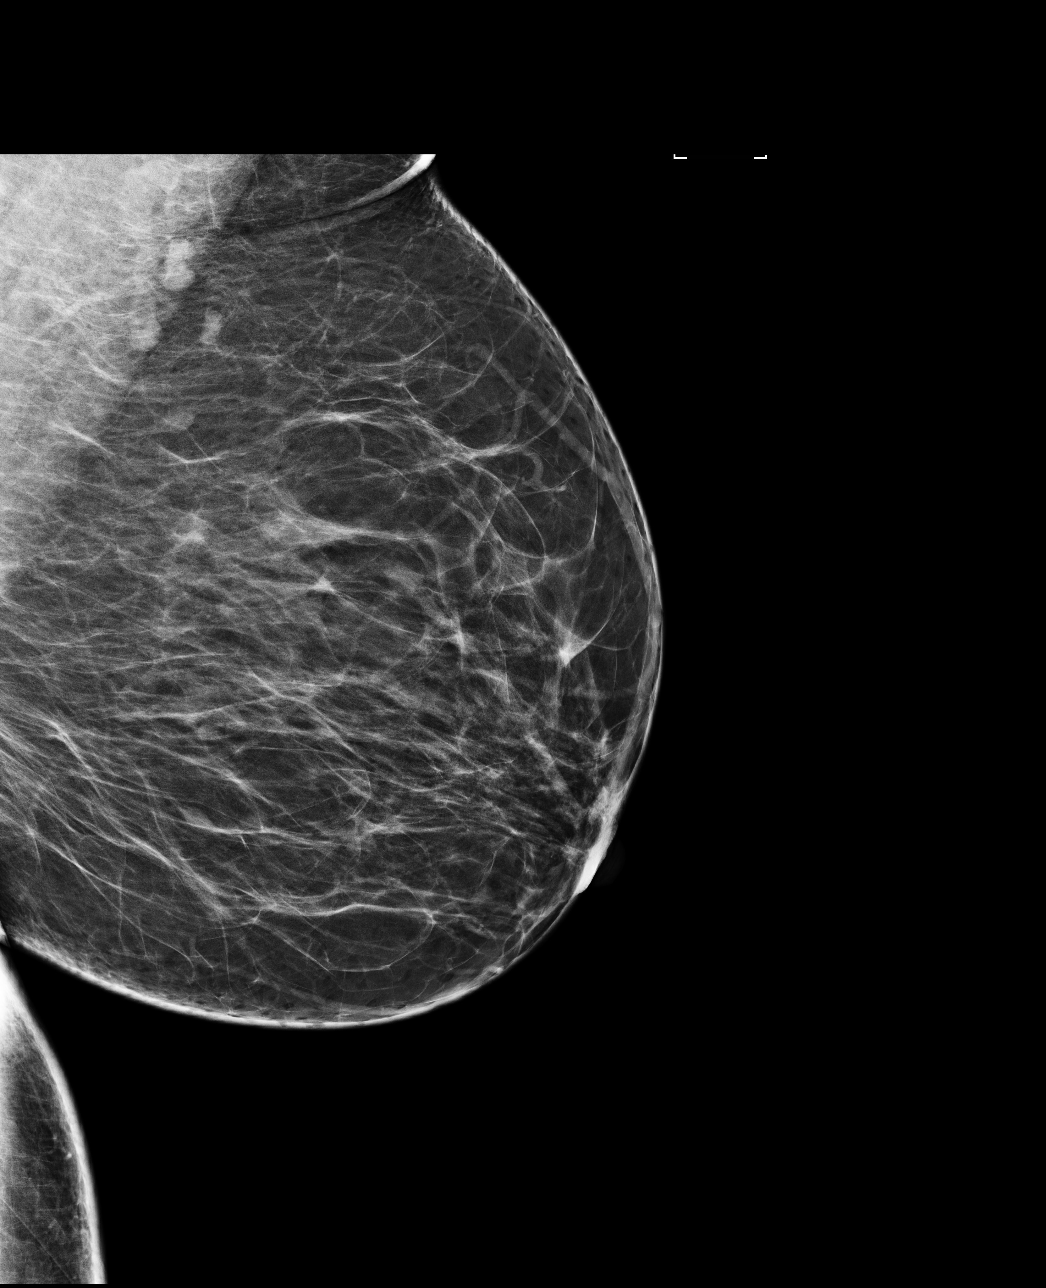

[R MLO]
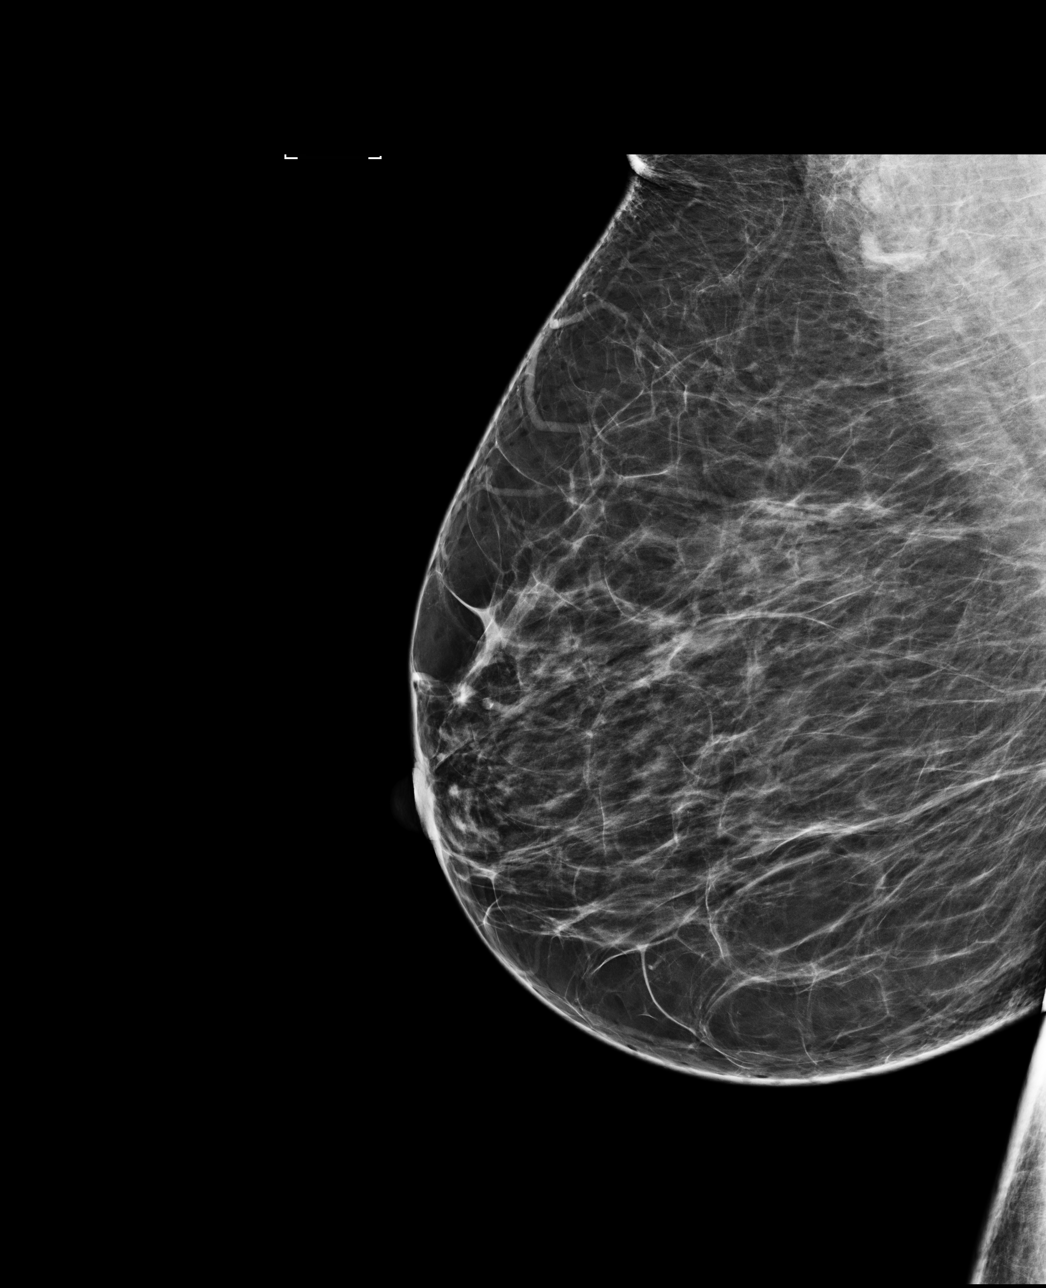

[R CC]
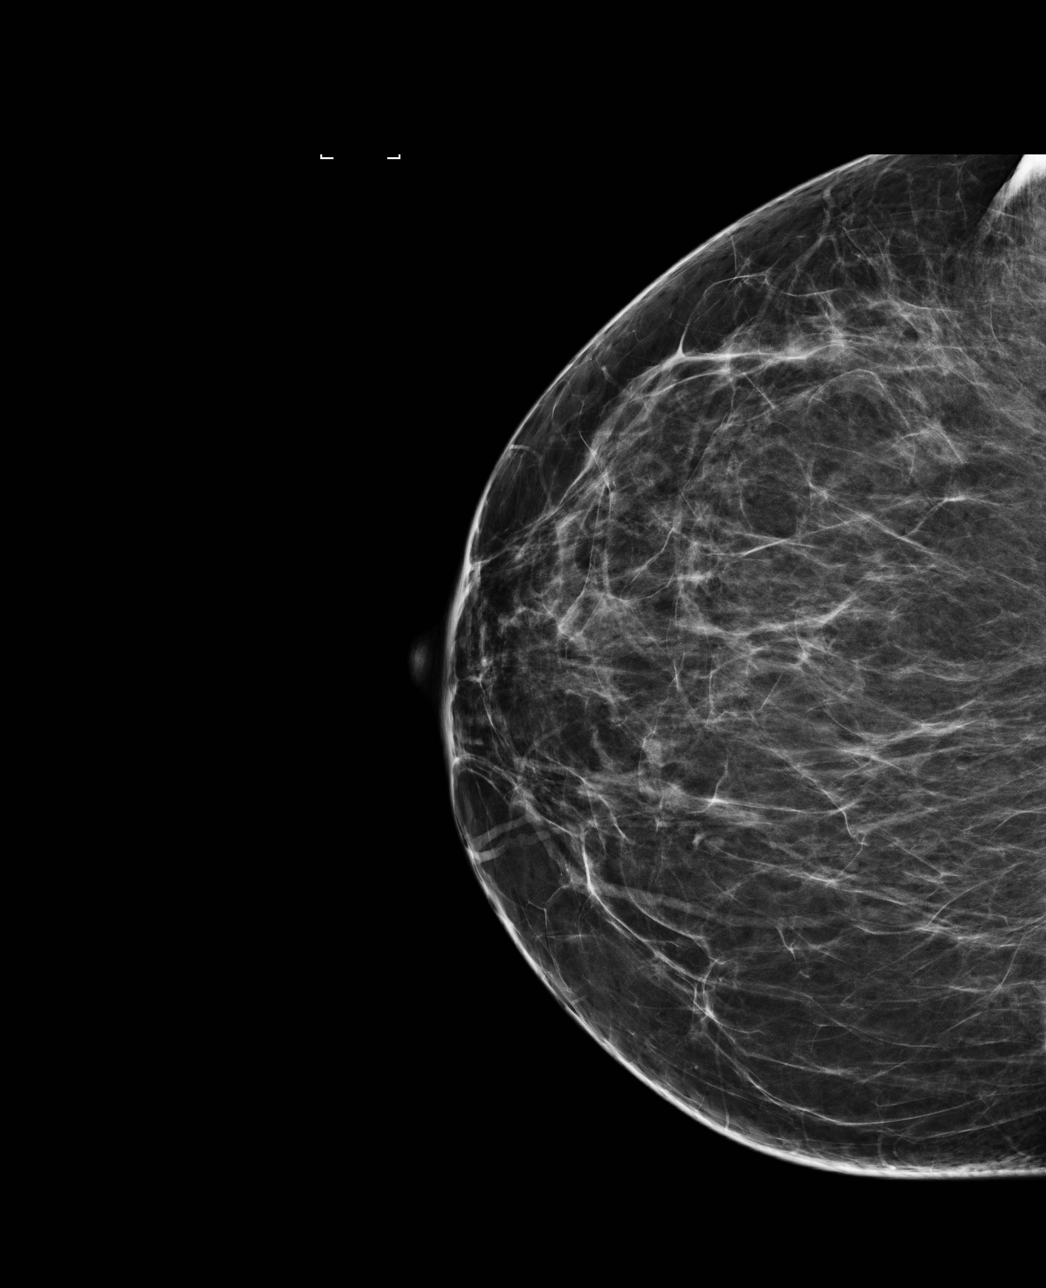

[4 of 4 positions shown; findings below may reference images not displayed]

ACR Breast Density Category b: There are scattered areas of
fibroglandular density.
FINDINGS: There are no findings suspicious for malignancy. Images were
processed with CAD.
IMPRESSION: No mammographic evidence of malignancy. A result letter of this
screening mammogram will be mailed directly to the patient.

RECOMMENDATION:
Screening mammogram in one year. (Code:AS-G-LCT)

BI-RADS CATEGORY  1: Negative.

## 2017-11-27 ENCOUNTER — Encounter (HOSPITAL_COMMUNITY): Payer: Self-pay | Admitting: Emergency Medicine

## 2017-11-27 ENCOUNTER — Other Ambulatory Visit: Payer: Self-pay

## 2017-11-27 DIAGNOSIS — Z794 Long term (current) use of insulin: Secondary | ICD-10-CM | POA: Insufficient documentation

## 2017-11-27 DIAGNOSIS — M545 Low back pain: Secondary | ICD-10-CM | POA: Insufficient documentation

## 2017-11-27 DIAGNOSIS — Z79899 Other long term (current) drug therapy: Secondary | ICD-10-CM | POA: Insufficient documentation

## 2017-11-27 DIAGNOSIS — R739 Hyperglycemia, unspecified: Secondary | ICD-10-CM | POA: Insufficient documentation

## 2017-11-27 DIAGNOSIS — E119 Type 2 diabetes mellitus without complications: Secondary | ICD-10-CM | POA: Insufficient documentation

## 2017-11-27 DIAGNOSIS — I10 Essential (primary) hypertension: Secondary | ICD-10-CM | POA: Insufficient documentation

## 2017-11-27 LAB — CBC
HCT: 41.4 % (ref 36.0–46.0)
HEMOGLOBIN: 13.9 g/dL (ref 12.0–15.0)
MCH: 27.9 pg (ref 26.0–34.0)
MCHC: 33.6 g/dL (ref 30.0–36.0)
MCV: 83.1 fL (ref 78.0–100.0)
Platelets: 281 10*3/uL (ref 150–400)
RBC: 4.98 MIL/uL (ref 3.87–5.11)
RDW: 13.2 % (ref 11.5–15.5)
WBC: 5.8 10*3/uL (ref 4.0–10.5)

## 2017-11-27 LAB — URINALYSIS, ROUTINE W REFLEX MICROSCOPIC
BACTERIA UA: NONE SEEN
Bilirubin Urine: NEGATIVE
Glucose, UA: >=500 mg/dL — AB
HGB URINE DIPSTICK: NEGATIVE
Ketones, ur: NEGATIVE mg/dL
Nitrite: NEGATIVE
PROTEIN: NEGATIVE mg/dL
SPECIFIC GRAVITY, URINE: 1.037 — AB (ref 1.005–1.030)
pH: 6 (ref 5.0–8.0)

## 2017-11-27 LAB — BASIC METABOLIC PANEL
ANION GAP: 9 (ref 5–15)
BUN: 12 mg/dL (ref 6–20)
CO2: 25 mmol/L (ref 22–32)
Calcium: 9.4 mg/dL (ref 8.9–10.3)
Chloride: 102 mmol/L (ref 101–111)
Creatinine, Ser: 0.48 mg/dL (ref 0.44–1.00)
GFR calc non Af Amer: >60 mL/min (ref >60)
GLUCOSE: 315 mg/dL — AB (ref 65–99)
Potassium: 3.5 mmol/L (ref 3.5–5.1)
Sodium: 136 mmol/L (ref 135–145)

## 2017-11-27 LAB — CBG MONITORING, ED: Glucose-Capillary: 433 mg/dL — ABNORMAL HIGH (ref 65–99)

## 2017-11-27 NOTE — ED Triage Notes (Signed)
Patient is a diabetic type ii and has not been checking her sugar. Patient has been thirsty, urinating a lot, has been tired, and back pain.

## 2017-11-28 ENCOUNTER — Emergency Department (HOSPITAL_COMMUNITY)
Admission: EM | Admit: 2017-11-28 | Discharge: 2017-11-28 | Disposition: A | Discharge status: Home / Self Care | Payer: Managed Care, Other (non HMO) | Attending: Emergency Medicine | Admitting: Emergency Medicine

## 2017-11-28 DIAGNOSIS — M545 Low back pain, unspecified: Secondary | ICD-10-CM

## 2017-11-28 DIAGNOSIS — R739 Hyperglycemia, unspecified: Secondary | ICD-10-CM

## 2017-11-28 LAB — CBG MONITORING, ED: GLUCOSE-CAPILLARY: 137 mg/dL — AB (ref 65–99)

## 2017-11-28 MED ORDER — METHOCARBAMOL 500 MG PO TABS: 500.0000 mg | ORAL_TABLET | Freq: Two times a day (BID) | ORAL | 0 refills | Status: AC

## 2017-11-28 MED ORDER — SODIUM CHLORIDE 0.9 % IV BOLUS (SEPSIS)
1000.0000 mL | Freq: Once | INTRAVENOUS | Status: AC
  Administered 2017-11-28: 1000 mL via INTRAVENOUS

## 2017-11-28 MED ORDER — NAPROXEN 500 MG PO TABS: 500.0000 mg | ORAL_TABLET | Freq: Two times a day (BID) | ORAL | 0 refills | Status: AC

## 2017-11-28 NOTE — ED Provider Notes (Signed)
[facility] DEPT Provider Note   CSN: [id number] Arrival date & time: 11/27/17  2111     History   Chief Complaint Chief Complaint  Patient presents with  . Back Pain  . Polydipsia  . Fatigue  . Urinary Frequency    HPI Ann Graves is a 47 y.o. female with a hx of IDDM, HTN, presents to the Emergency Department complaining of gradual, persistent, progressively worsening thirst, headache, fatigue, increased urination and fatigue onset 1 week ago. Associated symptoms include emesis x1 with NBNB emesis.  Nothing makes it better and nothing makes it worse.  CBG 600 at home tonight, but reports that she does not regularly check her CBG.  Pt reports taking her insulin and metformin.  No fever, chills, headache, neck pain, chest pain, SOB, abd pain, diarrhea, weakness, dizziness, syncope, open wounds.  Patient reports it has been more than 1 month since she checked her blood sugar.  She reports she has been eating high sugar foods over the holidays.  Pt also c/o 3 weeks of paraspinal low back pain.  Pt report lifting boxes at work and believes this is the cause.  She denies numbness, tingling, weakness, loss of bowel or bladder control.   Patient denies gait disturbance.   The history is provided by the patient and medical records. No language interpreter was used.    Past Medical History:  Diagnosis Date  . Diabetes mellitus   . Fibroid    Adenomyoma  . GERD (gastroesophageal reflux disease)   . Hypertension   . Urinary urgency     Patient Active Problem List   Diagnosis Date Noted  . Diabetes mellitus, type II ([place]) 10/17/2012  . HTN (hypertension) 10/17/2012    Past Surgical History:  Procedure Laterality Date  . ENDOMETRIAL ABLATION    . THYROIDECTOMY  AUGUST 2013  . TUBAL LIGATION    . VAGINAL HYSTERECTOMY  2009   Left paratubal cyst    OB History    Gravida Para Term Preterm AB Living   3 2 2   1 2    SAB TAB Ectopic Multiple Live  Births                   Home Medications    Prior to Admission medications   Medication Sig Start Date End Date Taking? Authorizing Provider  Insulin Glargine (BASAGLAR KWIKPEN) 100 UNIT/ML SOPN Inject 50 Units into the skin daily. 09/16/17  Yes [provider]  levothyroxine (SYNTHROID, LEVOTHROID) 150 MCG tablet Take 150 mcg by mouth daily.   Yes [provider]  LINZESS 145 MCG CAPS capsule Take 145 mcg by mouth daily as needed (constipation).  08/31/17  Yes [provider]  lisinopril-hydrochlorothiazide (PRINZIDE,ZESTORETIC) 10-12.5 MG tablet Take 1 tablet by mouth daily. 09/23/17  Yes [provider]  metFORMIN (GLUCOPHAGE) 500 MG tablet Take 500 mg by mouth daily with breakfast.  06/08/16  Yes [provider]  methocarbamol (ROBAXIN) 500 MG tablet Take 1 tablet (500 mg total) by mouth 2 (two) times daily. 11/28/17   Alain Deschene, Jarrett Soho, PA-C  naproxen (NAPROSYN) 500 MG tablet Take 1 tablet (500 mg total) by mouth 2 (two) times daily with a meal. 11/28/17   Glendell Schlottman, Jarrett Soho, PA-C    Family History Family History  Problem Relation Age of Onset  . Diabetes Mother   . Hypertension Mother   . Heart disease Mother   . Diabetes Father   . Hypertension Father   . Heart  disease Father   . Hypertension Maternal Grandmother   . Heart disease Maternal Grandmother   . Hypertension Maternal Grandfather   . Cancer Maternal Uncle        Lung cancer    Social History Social History   Tobacco Use  . Smoking status: Never Smoker  . Smokeless tobacco: Never Used  Substance Use Topics  . Alcohol use: Yes    Comment: rare  . Drug use: No     Allergies   Patient has no known allergies.   Review of Systems Review of Systems  Constitutional: Negative for appetite change, diaphoresis, fatigue, fever and unexpected weight change.  HENT: Negative for mouth sores.   Eyes: Negative for visual disturbance.  Respiratory: Negative for  cough, chest tightness, shortness of breath and wheezing.   Cardiovascular: Negative for chest pain.  Gastrointestinal: Positive for vomiting ( x1). Negative for abdominal pain, constipation, diarrhea and nausea.  Endocrine: Positive for polydipsia and polyuria. Negative for polyphagia.  Genitourinary: Positive for frequency. Negative for dysuria, hematuria and urgency.  Musculoskeletal: Positive for back pain. Negative for neck stiffness.  Skin: Negative for rash and wound.  Allergic/Immunologic: Negative for immunocompromised state.  Neurological: Negative for syncope, light-headedness and headaches.  Hematological: Does not bruise/bleed easily.  Psychiatric/Behavioral: Negative for sleep disturbance. The patient is not nervous/anxious.      Physical Exam Updated Vital Signs BP (!) 157/124 (BP Location: Left Arm)   Pulse 64   Temp 97.9 F (36.6 C) (Oral)   Resp 14   Ht 5\' 5"  (1.651 m)   Wt 108.2 kg (238 lb 9.6 oz)   SpO2 99%   BMI 39.71 kg/m   Physical Exam  Constitutional: She appears well-developed and well-nourished. No distress.  Awake, alert, nontoxic appearance  HENT:  Head: Normocephalic and atraumatic.  Mouth/Throat: Oropharynx is clear and moist. Mucous membranes are dry. No oropharyngeal exudate.  Eyes: Conjunctivae are normal. No scleral icterus.  Neck: Normal range of motion. Neck supple.  Full ROM without pain  Cardiovascular: Normal rate, regular rhythm and intact distal pulses.  Pulmonary/Chest: Effort normal and breath sounds normal. No respiratory distress. She has no wheezes.  Equal chest expansion  Abdominal: Soft. Bowel sounds are normal. She exhibits no distension and no mass. There is no tenderness. There is no rebound and no guarding.  Musculoskeletal: Normal range of motion. She exhibits no edema.  Full range of motion of the T-spine and L-spine No midline tenderness to the  T-spine or L-spine Tenderness to palpation of the bilateral paraspinous  muscles of the L-spine  Lymphadenopathy:    She has no cervical adenopathy.  Neurological: She is alert.  Speech is clear and goal oriented, follows commands Normal 5/5 strength in upper and lower extremities bilaterally including dorsiflexion and plantar flexion, strong and equal grip strength Sensation normal to light and sharp touch Moves extremities without ataxia, coordination intact Normal gait Normal balance No Clonus  Skin: Skin is warm and dry. No rash noted. She is not diaphoretic. No erythema.  Psychiatric: She has a normal mood and affect. Her behavior is normal.  Nursing note and vitals reviewed.    ED Treatments / Results  Labs (all labs ordered are listed, but only abnormal results are displayed) Labs Reviewed  BASIC METABOLIC PANEL - Abnormal; Notable for the following components:      Result Value   Glucose, Bld 315 (*)    All other components within normal limits  URINALYSIS, ROUTINE W REFLEX MICROSCOPIC -  Abnormal; Notable for the following components:   Color, Urine STRAW (*)    Specific Gravity, Urine 1.037 (*)    Glucose, UA >=500 (*)    Leukocytes, UA TRACE (*)    Squamous Epithelial / LPF 0-5 (*)    All other components within normal limits  CBG MONITORING, ED - Abnormal; Notable for the following components:   Glucose-Capillary 433 (*)    All other components within normal limits  CBG MONITORING, ED - Abnormal; Notable for the following components:   Glucose-Capillary 137 (*)    All other components within normal limits  CBC  CBG MONITORING, ED    EKG  EKG Interpretation  Date/Time:  Saturday November 27 2017 22:22:18 EST Ventricular Rate:  67 PR Interval:    QRS Duration: 95 QT Interval:  397 QTC Calculation: 420 R Axis:   36 Text Interpretation:  Sinus rhythm ST elev, probable normal early repol pattern since last tracing no significant change Confirmed by Malvin Johns [phone]) on 11/27/2017 10:43:45 PM       Procedures Procedures  (including critical care time)  Medications Ordered in ED Medications  sodium chloride 0.9 % bolus 1,000 mL (1,000 mLs Intravenous New Bag/Given 11/28/17 0436)     Initial Impression / Assessment and Plan / ED Course  I have reviewed the triage vital signs and the nursing notes.  Pertinent labs & imaging results that were available during my care of the patient were reviewed by me and considered in my medical decision making (see chart for details).     Patient presents with symptoms of hyperglycemia.  She is indeed hyperglycemic today.  No evidence of DKA.  No ketones in her urine or anion gap.  Patient given fluids here in the emergency department.  She reports she is feeling much better now.  Patient does have insulin at home.  Encouraged her to check her blood sugar regularly and take insulin as prescribed.  Blood sugar has improved significantly here in the emergency department.  She has tolerated p.o. fluids without emesis.  Her abdomen is soft and nontender.  She is without fever or open wounds.  Fluids given in the department.  Patient with back pain.  No neurological deficits and normal neuro exam.  Patient can walk but states is painful.  No loss of bowel or bladder control.  No concern for cauda equina.  No fever, night sweats, weight loss, h/o cancer, IVDU.  RICE protocol and pain medicine indicated and discussed with patient.    Final Clinical Impressions(s) / ED Diagnoses   Final diagnoses:  Acute bilateral low back pain without sciatica  Hyperglycemia    ED Discharge Orders        Ordered    naproxen (NAPROSYN) 500 MG tablet  2 times daily with meals     11/28/17 0618    methocarbamol (ROBAXIN) 500 MG tablet  2 times daily     11/28/17 0618       Eusevio Schriver, Jarrett Soho, PA-C 11/28/17 3220    Varney Biles, MD 11/28/17 2310

## 2017-11-28 NOTE — ED Notes (Signed)
Pt stated that her bilateral flank started hurting a few weeks ago and then a week later she started experiencing polyuria and polydipsia. She stated that she has had hyperglycemia before but has not experienced symptoms of polyuria or polydipsia this severe before.

## 2017-11-28 NOTE — Discharge Instructions (Signed)

## 2019-04-20 ENCOUNTER — Other Ambulatory Visit: Payer: Self-pay | Admitting: Internal Medicine

## 2019-04-20 DIAGNOSIS — Z1231 Encounter for screening mammogram for malignant neoplasm of breast: Secondary | ICD-10-CM

## 2021-01-29 ENCOUNTER — Encounter: Payer: Self-pay | Admitting: Dietician

## 2021-01-29 ENCOUNTER — Other Ambulatory Visit: Payer: Self-pay

## 2021-01-29 ENCOUNTER — Encounter: Payer: Managed Care, Other (non HMO) | Attending: Physician Assistant | Admitting: Dietician

## 2021-01-29 VITALS — Ht 65.0 in | Wt 232.7 lb

## 2021-01-29 DIAGNOSIS — E119 Type 2 diabetes mellitus without complications: Secondary | ICD-10-CM | POA: Insufficient documentation

## 2021-01-29 DIAGNOSIS — E782 Mixed hyperlipidemia: Secondary | ICD-10-CM | POA: Diagnosis present

## 2021-01-29 DIAGNOSIS — I10 Essential (primary) hypertension: Secondary | ICD-10-CM | POA: Insufficient documentation

## 2021-01-29 NOTE — Patient Instructions (Signed)
Consider diet or "zero" gingerale more often then regular gingerale.  Remember that ANY physical activity helps to lower your blood sugar. Do your yard work, ride your bike  Work towards eating three meals a day, about 5-6 hours apart!  Begin to recognize carbohydrates in your food choices!  Have 3 carb choices at each meal (45 g).   Begin to build your meals using the proportions of the Balanced Plate. . First, select your carb choice(s) for the meal, and determine how much you should have to equal 3 carb choices (45 g). . Next, select your source of protein to pair with your carb choice(s). . Finally, complete the remaining half of your meal with a variety of non-starchy vegetables.  Look for your blood sugar to be under 125 when you check in the morning!

## 2021-01-29 NOTE — Progress Notes (Signed)
Diabetes Self-Management Education  Visit Type: First/Initial  Appt. Start Time: 1400 Appt. End Time: 1500  01/29/2021  [name], identified by name and date of birth, is a 50 y.o. female with a diagnosis of Diabetes: Type 2.   ASSESSMENT  Pt has long history of T2D. Pt reports being raised from 50 units to 65 units of Basaglar due to elevation of A1c. Pt reports trying Trulicity for a 3 week sample period and states that their blood sugar dropped dramatically, seeing FBG under 120. Pt reports insurance not covering Trulicity, so their doctor is trying to find assistance.  Pt reports no trouble injecting insulin, and knows to rotate sites. Current FBG of 211 this morning (01/29/21) Pt reports skipping meals occasionally, states they just won't eat if they are hungry. Usually dinner if they miss a meal. Pt reports liking water, gingerale, and occasionally mountain dew. Pt doesn't drink sweet tea due to it raising their blood sugar and making them urinate frequently. Pt reports high levels of stress. Pt states worrying about their son and nephew for years and years has been the main source of stress. Pt states they have their granddaughter now and wants to improve their health for her.  Height 5\' 5"  (1.651 m), weight 232 lb 11.2 oz (105.6 kg). Body mass index is 38.72 kg/m.   Diabetes Self-Management Education - 01/29/21 1402      Visit Information   Visit Type First/Initial      Initial Visit   Diabetes Type Type 2    Are you currently following a meal plan? No    Are you taking your medications as prescribed? Yes    Date Diagnosed [place]   How would you rate your overall health? Good      Psychosocial Assessment   Patient Belief/Attitude about Diabetes Motivated to manage diabetes    Self-management support Doctor's office;Family    Other persons present Patient    Patient Concerns Nutrition/Meal planning;Healthy Lifestyle    Special Needs None     Preferred Learning Style No preference indicated    Learning Readiness Ready    How often do you need to have someone help you when you read instructions, pamphlets, or other written materials from your doctor or pharmacy? 1 - Never    What is the last grade level you completed in school? 12      Pre-Education Assessment   Patient understands the diabetes disease and treatment process. Needs Instruction    Patient understands incorporating nutritional management into lifestyle. Needs Instruction    Patient undertands incorporating physical activity into lifestyle. Needs Instruction    Patient understands using medications safely. Needs Instruction    Patient understands monitoring blood glucose, interpreting and using results Needs Instruction    Patient understands prevention, detection, and treatment of acute complications. Needs Instruction    Patient understands prevention, detection, and treatment of chronic complications. Needs Instruction    Patient understands how to develop strategies to address psychosocial issues. Needs Instruction    Patient understands how to develop strategies to promote health/change behavior. Needs Instruction      Complications   Last HgB A1C per patient/outside source 11 %    How often do you check your blood sugar? 1-2 times/day    Fasting Blood glucose range (mg/dL) >200   pt reports FBG of 211 this morning   Have you had a dilated eye exam in the past 12 months? Yes  Have you had a dental exam in the past 12 months? Yes    Are you checking your feet? Yes    How many days per week are you checking your feet? 7      Dietary Intake   Breakfast [place] sausage, egg, and cheese biscuit, muffin, ginger ale    Lunch Grilled chicken breast, baked beans, small piece of steak    Snack (afternoon) none    Dinner none    Snack (evening) none    Beverage(s) Ginger Ale      Exercise   Exercise Type ADL's    How many days per week to you exercise? 0     How many minutes per day do you exercise? 0    Total minutes per week of exercise 0      Patient Education   Previous Diabetes Education No    Disease state  Factors that contribute to the development of diabetes    Nutrition management  Role of diet in the treatment of diabetes and the relationship between the three main macronutrients and blood glucose level;Carbohydrate counting    Physical activity and exercise  Role of exercise on diabetes management, blood pressure control and cardiac health.;Helped patient identify appropriate exercises in relation to his/her diabetes, diabetes complications and other health issue.    Medications Reviewed patients medication for diabetes, action, purpose, timing of dose and side effects.;Taught/reviewed insulin injection, site rotation, insulin storage and needle disposal.    Monitoring Purpose and frequency of SMBG.    Acute complications Discussed and identified patients' treatment of hyperglycemia.    Chronic complications Retinopathy and reason for yearly dilated eye exams;Nephropathy, what it is, prevention of, the use of ACE, ARB's and early detection of through urine microalbumia.;Reviewed with patient heart disease, higher risk of, and prevention;Identified and discussed with patient  current chronic complications;Lipid levels, blood glucose control and heart disease;Relationship between chronic complications and blood glucose control    Psychosocial adjustment Role of stress on diabetes;Helped patient identify a support system for diabetes management    Personal strategies to promote health Lifestyle issues that need to be addressed for better diabetes care      Individualized Goals (developed by patient)   Nutrition Follow meal plan discussed    Physical Activity Exercise 1-2 times per week    Medications take my medication as prescribed    Monitoring  test my blood glucose as discussed      Post-Education Assessment   Patient understands  the diabetes disease and treatment process. Needs Review    Patient understands incorporating nutritional management into lifestyle. Needs Review    Patient undertands incorporating physical activity into lifestyle. Needs Review    Patient understands using medications safely. Needs Review    Patient understands monitoring blood glucose, interpreting and using results Needs Review    Patient understands prevention, detection, and treatment of acute complications. Needs Review    Patient understands prevention, detection, and treatment of chronic complications. Needs Review    Patient understands how to develop strategies to address psychosocial issues. Needs Review    Patient understands how to develop strategies to promote health/change behavior. Needs Review      Outcomes   Expected Outcomes Demonstrated limited interest in learning.  Expect minimal changes    Future DMSE 2 months    Program Status Not Completed           Individualized Plan for Diabetes Self-Management Training:   Learning Objective:  Patient will have  a greater understanding of diabetes self-management. Patient education plan is to attend individual and/or group sessions per assessed needs and concerns.   Plan:   Patient Instructions  Consider diet or "zero" gingerale more often then regular gingerale.  Remember that ANY physical activity helps to lower your blood sugar. Do your yard work, ride your bike  Work towards eating three meals a day, about 5-6 hours apart!  Begin to recognize carbohydrates in your food choices!  Have 3 carb choices at each meal (45 g).   Begin to build your meals using the proportions of the Balanced Plate. . First, select your carb choice(s) for the meal, and determine how much you should have to equal 3 carb choices (45 g). . Next, select your source of protein to pair with your carb choice(s). . Finally, complete the remaining half of your meal with a variety of non-starchy  vegetables.  Look for your blood sugar to be under 125 when you check in the morning!    Expected Outcomes:  Demonstrated limited interest in learning.  Expect minimal changes  Education material provided: Meal plan card, My Plate and Snack sheet  If problems or questions, patient to contact team via:  Phone and Email  Future DSME appointment: 2 months

## 2021-04-03 ENCOUNTER — Ambulatory Visit: Payer: Managed Care, Other (non HMO) | Admitting: Dietician

## 2021-10-22 ENCOUNTER — Ambulatory Visit: Payer: Managed Care, Other (non HMO) | Admitting: Podiatry

## 2022-04-29 ENCOUNTER — Other Ambulatory Visit (HOSPITAL_COMMUNITY): Payer: Self-pay

## 2022-04-30 ENCOUNTER — Ambulatory Visit (INDEPENDENT_AMBULATORY_CARE_PROVIDER_SITE_OTHER): Payer: Managed Care, Other (non HMO) | Admitting: Primary Care
